# Patient Record
Sex: Male | Born: 1985 | Race: Black or African American | Hispanic: No | Marital: Single | Smoking: Current every day smoker
Health system: Southern US, Community
[De-identification: ages and names within clinical notes are randomized; demographics above are authoritative.]

## PROBLEM LIST (undated history)

## (undated) DIAGNOSIS — J42 Unspecified chronic bronchitis: Secondary | ICD-10-CM

## (undated) DIAGNOSIS — K5190 Ulcerative colitis, unspecified, without complications: Secondary | ICD-10-CM

## (undated) DIAGNOSIS — I10 Essential (primary) hypertension: Secondary | ICD-10-CM

## (undated) HISTORY — PX: HERNIA REPAIR: SHX51

## (undated) HISTORY — PX: APPENDECTOMY: SHX54

## (undated) HISTORY — PX: COLON SURGERY: SHX602

## (undated) HISTORY — PX: CHOLECYSTECTOMY: SHX55

---

## 2017-04-26 ENCOUNTER — Encounter (HOSPITAL_COMMUNITY): Payer: Self-pay | Admitting: Emergency Medicine

## 2017-04-26 ENCOUNTER — Emergency Department (HOSPITAL_COMMUNITY): Payer: Self-pay

## 2017-04-26 ENCOUNTER — Emergency Department (HOSPITAL_COMMUNITY)
Admission: EM | Admit: 2017-04-26 | Discharge: 2017-04-26 | Disposition: A | Discharge status: Home / Self Care | Payer: Self-pay | Attending: Emergency Medicine | Admitting: Emergency Medicine

## 2017-04-26 DIAGNOSIS — R1012 Left upper quadrant pain: Secondary | ICD-10-CM | POA: Insufficient documentation

## 2017-04-26 DIAGNOSIS — R112 Nausea with vomiting, unspecified: Secondary | ICD-10-CM | POA: Insufficient documentation

## 2017-04-26 LAB — URINALYSIS, ROUTINE W REFLEX MICROSCOPIC
BILIRUBIN URINE: NEGATIVE
Glucose, UA: NEGATIVE mg/dL
KETONES UR: 5 mg/dL — AB
NITRITE: NEGATIVE
PH: 6 (ref 5.0–8.0)
PROTEIN: NEGATIVE mg/dL
Specific Gravity, Urine: 1.038 — ABNORMAL HIGH (ref 1.005–1.030)

## 2017-04-26 LAB — CBC
HEMATOCRIT: 44.7 % (ref 39.0–52.0)
HEMOGLOBIN: 15.3 g/dL (ref 13.0–17.0)
MCH: 30.6 pg (ref 26.0–34.0)
MCHC: 34.2 g/dL (ref 30.0–36.0)
MCV: 89.4 fL (ref 78.0–100.0)
Platelets: 264 10*3/uL (ref 150–400)
RBC: 5 MIL/uL (ref 4.22–5.81)
RDW: 13.2 % (ref 11.5–15.5)
WBC: 7.6 10*3/uL (ref 4.0–10.5)

## 2017-04-26 LAB — COMPREHENSIVE METABOLIC PANEL
ALT: 17 U/L (ref 17–63)
ANION GAP: 10 (ref 5–15)
AST: 16 U/L (ref 15–41)
Albumin: 4 g/dL (ref 3.5–5.0)
Alkaline Phosphatase: 71 U/L (ref 38–126)
BUN: 11 mg/dL (ref 6–20)
CO2: 21 mmol/L — ABNORMAL LOW (ref 22–32)
Calcium: 9 mg/dL (ref 8.9–10.3)
Chloride: 108 mmol/L (ref 101–111)
Creatinine, Ser: 0.95 mg/dL (ref 0.61–1.24)
Glucose, Bld: 94 mg/dL (ref 65–99)
POTASSIUM: 4.1 mmol/L (ref 3.5–5.1)
Sodium: 139 mmol/L (ref 135–145)
Total Bilirubin: 0.5 mg/dL (ref 0.3–1.2)
Total Protein: 6.7 g/dL (ref 6.5–8.1)

## 2017-04-26 LAB — LIPASE, BLOOD: LIPASE: 98 U/L — AB (ref 11–51)

## 2017-04-26 MED ORDER — PANTOPRAZOLE SODIUM 40 MG PO TBEC: 40.0000 mg | DELAYED_RELEASE_TABLET | Freq: Every day | ORAL | 0 refills | Status: DC

## 2017-04-26 MED ORDER — PANTOPRAZOLE SODIUM 40 MG IV SOLR
40.0000 mg | Freq: Once | INTRAVENOUS | Status: AC
  Administered 2017-04-26: 40 mg via INTRAVENOUS
  Filled 2017-04-26: qty 40

## 2017-04-26 MED ORDER — ONDANSETRON 4 MG PO TBDP: ORAL_TABLET | ORAL | 0 refills | Status: DC

## 2017-04-26 MED ORDER — HYDROMORPHONE HCL 1 MG/ML IJ SOLN
1.0000 mg | Freq: Once | INTRAMUSCULAR | Status: AC
  Administered 2017-04-26: 1 mg via INTRAVENOUS
  Filled 2017-04-26: qty 1

## 2017-04-26 MED ORDER — CEPHALEXIN 500 MG PO CAPS: 500.0000 mg | ORAL_CAPSULE | Freq: Three times a day (TID) | ORAL | 0 refills | Status: AC

## 2017-04-26 MED ORDER — ONDANSETRON HCL 4 MG/2ML IJ SOLN
4.0000 mg | Freq: Once | INTRAMUSCULAR | Status: AC
  Administered 2017-04-26: 4 mg via INTRAVENOUS
  Filled 2017-04-26: qty 2

## 2017-04-26 MED ORDER — SODIUM CHLORIDE 0.9 % IV BOLUS (SEPSIS)
1000.0000 mL | Freq: Once | INTRAVENOUS | Status: AC
  Administered 2017-04-26: 1000 mL via INTRAVENOUS

## 2017-04-26 MED ORDER — IOPAMIDOL (ISOVUE-300) INJECTION 61%
INTRAVENOUS | Status: AC
  Administered 2017-04-26: 100 mL
  Filled 2017-04-26: qty 100

## 2017-04-26 MED ORDER — GI COCKTAIL ~~LOC~~
30.0000 mL | Freq: Once | ORAL | Status: AC
  Administered 2017-04-26: 30 mL via ORAL
  Filled 2017-04-26: qty 30

## 2017-04-26 NOTE — ED Notes (Signed)
ED Provider at bedside. 

## 2017-04-26 NOTE — ED Notes (Signed)
Pt reports LUQ pain with N/V x 2-3 days. Pt reports pain is much worse upon eating. Denies CP, SOB, fever, chills.

## 2017-04-26 NOTE — ED Notes (Signed)
Patient transported to CT 

## 2017-04-26 NOTE — ED Notes (Signed)
Pt given urinal for urine culture

## 2017-04-26 NOTE — ED Provider Notes (Signed)
[facility] EMERGENCY DEPARTMENT Provider Note   CSN: [id number] Arrival date & time: 04/26/17  1017     History   Chief Complaint Chief Complaint  Patient presents with  . Abdominal Pain    HPI  Justin Wise is a 32 y.o. Male with a history multiple abdominal surgeries, appendectomy, cholecystectomy, partial colectomy s/p colostomy reversal, and ventral hernia repair with mesh, who presents to the ED for evaluation of upper quadrant pain with nausea and vomiting for the past 2-3 days.  Patient reports pain is a constant dull ache that becomes sharp and worse after eating.  He reports he is taken several doses of Advil has not tried anything else to treat his symptoms.  He reports some associated nausea and vomiting, denies hematemesis,  few loose stools but no diarrhea, no melena or hematochezia. Pt denies fevers or chills, no chest pain or shortness of breath. Pt denies dysuria, frequency or penile discharge, no hx of kidney stones.  Patient denies any alcohol use, does smoke about a pack a day, denies any other substance use.      History reviewed. No pertinent past medical history.  There are no active problems to display for this patient.   History reviewed. No pertinent surgical history.     Home Medications    Prior to Admission medications   Not on File    Family History History reviewed. No pertinent family history.  Social History Social History   Tobacco Use  . Smoking status: Never Smoker  . Smokeless tobacco: Never Used  Substance Use Topics  . Alcohol use: No    Frequency: Never  . Drug use: No     Allergies   Patient has no allergy information on record.   Review of Systems Review of Systems  Constitutional: Negative for chills and fever.  HENT: Negative for congestion, rhinorrhea and sore throat.   Respiratory: Negative for cough and shortness of breath.   Cardiovascular: Negative for chest pain.  Gastrointestinal:  Positive for abdominal pain, nausea and vomiting. Negative for blood in stool, constipation and diarrhea.  Genitourinary: Negative for discharge, dysuria, frequency, penile pain, penile swelling, scrotal swelling and testicular pain.  Musculoskeletal: Negative for arthralgias and myalgias.  Skin: Negative for color change and pallor.  Neurological: Negative for dizziness and headaches.     Physical Exam Updated Vital Signs BP (!) 135/101 (BP Location: Right Arm)   Pulse 75   Temp 98.9 F (37.2 C) (Oral)   Resp 20   Ht 6\' 5"  (1.956 m)   Wt (!) 171.9 kg (379 lb)   SpO2 98%   BMI 44.94 kg/m   Physical Exam  Constitutional: He appears well-developed and well-nourished. No distress.  Patient appears uncomfortable but is in no acute distress  HENT:  Head: Normocephalic and atraumatic.  Eyes: Right eye exhibits no discharge. Left eye exhibits no discharge.  Cardiovascular: Normal rate, regular rhythm, normal heart sounds and intact distal pulses.  Pulmonary/Chest: Effort normal and breath sounds normal. No respiratory distress. He has no wheezes. He has no rhonchi. He has no rales.  Respirations equal and unlabored, patient able to speak in full sentences, lungs clear to auscultation bilaterally  Abdominal: Soft. Bowel sounds are normal. He exhibits no distension. There is tenderness. There is guarding.  Multiple previous surgical scars noted, abdomen is soft and nondistended with bowel sounds present, focal tenderness in the right upper quadrant and mild tenderness in the right lower quadrant with mild guarding, no  right-sided abdominal pain  Musculoskeletal: He exhibits no edema or deformity.  Neurological: He is alert. Coordination normal.  Skin: Skin is warm and dry. Capillary refill takes less than 2 seconds. He is not diaphoretic.  Psychiatric: He has a normal mood and affect. His behavior is normal.  Nursing note and vitals reviewed.    ED Treatments / Results  Labs (all  labs ordered are listed, but only abnormal results are displayed) Labs Reviewed  LIPASE, BLOOD - Abnormal; Notable for the following components:      Result Value   Lipase 98 (*)    All other components within normal limits  COMPREHENSIVE METABOLIC PANEL - Abnormal; Notable for the following components:   CO2 21 (*)    All other components within normal limits  URINALYSIS, ROUTINE W REFLEX MICROSCOPIC - Abnormal; Notable for the following components:   Specific Gravity, Urine 1.038 (*)    Hgb urine dipstick MODERATE (*)    Ketones, ur 5 (*)    Leukocytes, UA LARGE (*)    Bacteria, UA FEW (*)    Squamous Epithelial / LPF 0-5 (*)    All other components within normal limits  URINE CULTURE  CBC    EKG  EKG Interpretation None       Radiology Ct Abdomen Pelvis W Contrast  Result Date: 04/26/2017 CLINICAL DATA:  Left upper quadrant pain and nausea. EXAM: CT ABDOMEN AND PELVIS WITH CONTRAST TECHNIQUE: Multidetector CT imaging of the abdomen and pelvis was performed using the standard protocol following bolus administration of intravenous contrast. CONTRAST:  ISOVUE-300 IOPAMIDOL (ISOVUE-300) INJECTION 61% COMPARISON:  None. FINDINGS: Lower chest: There is minimal linear scarring or atelectasis at the left lung base. Tiny amount of pericardial fluid. Heart size is normal. Otherwise, the negative. Hepatobiliary: No focal liver abnormality is seen. Status post cholecystectomy. No biliary dilatation. Pancreas: Unremarkable. No pancreatic ductal dilatation or surrounding inflammatory changes. Spleen: Normal in size without focal abnormality. Adrenals/Urinary Tract: Adrenal glands are unremarkable. Kidneys are normal, without renal calculi, focal lesion, or hydronephrosis. Bladder is unremarkable. Stomach/Bowel: Surgical staple line in the sigmoid region consistent with previous colon resection. Surgical clips consistent with previous appendectomy. There are a few diverticula in the  descending colon. No diverticulitis. Vascular/Lymphatic: No significant vascular findings are present. No enlarged abdominal or pelvic lymph nodes. Reproductive: Prostate is unremarkable. Other: There is a lobulated area of subcutaneous fat anterior to the right rectus muscle above the umbilicus with thickened margins. The diaphragm is thickened but otherwise normal. This is not felt to be significant. 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 Chronic spondylolisthesis at L5-S1. No spinal stenosis. IMPRESSION: 1. No acute intra-abdominal abnormality. 2. Minimal diverticulosis of the left side of the colon. 3. Area of probable scarring in the subcutaneous fat of the right mid abdomen anterior to the right rectus muscle. Electronically Signed   By: Francene Boyers M.D.   On: 04/26/2017 15:01    Procedures Procedures (including critical care time)  Medications Ordered in ED Medications  sodium chloride 0.9 % bolus 1,000 mL (0 mLs Intravenous Stopped 04/26/17 1513)  ondansetron (ZOFRAN) injection 4 mg (4 mg Intravenous Given 04/26/17 1400)  HYDROmorphone (DILAUDID) injection 1 mg (1 mg Intravenous Given 04/26/17 1400)  iopamidol (ISOVUE-300) 61 % injection (100 mLs  Contrast Given 04/26/17 1431)  pantoprazole (PROTONIX) injection 40 mg (40 mg Intravenous Given 04/26/17 1533)  gi cocktail (Maalox,Lidocaine,Donnatal) (30 mLs Oral Given 04/26/17 1533)  Initial Impression / Assessment and Plan / ED Course  I have reviewed the triage vital signs and the nursing notes.  Pertinent labs & imaging results that were available during my care of the patient were reviewed by me and considered in my medical decision making (see chart for details).  Pt presents with 2 days of LUQ pain w/ associated with nausea and vomiting, no hematemesis or melena. No fevers or chills.  On exam patient is focally tender in the left upper quadrant with guarding, with mild left lower  quadrant tenderness.  Given location of patient's tenderness I am very suspicious of gastritis especially since pain is worse with eating, but given the patient has had numerous abdominal surgeries and is at high risk for adhesions, obstruction and has altered anatomy imaging is necessary, will get CT abdomen pelvis, and labs. Will give fluids, dilaudid and zofran.  Labs overall reassuring there is no leukocytosis, normal hemoglobin, no acute electrolyte derangements requiring intervention, normal kidney and liver function, lipase is mildly elevated at 98.  Patient's urine does show RBCs and too numerous to count WBCs with a few bacteria present, though patient denies urinary symptoms and feels fairly concerning for urinary tract infection, patient denies recent sexual activity or exposure to STDs and is not experiencing any symptoms in line with that.  Will send culture and treat with Keflex.  CT shows no acute intra-abdominal abnormality there is mild diverticulosis of the left side of the colon and stable scarring of the subcutaneous fat over the right mid abdomen where patient has previous surgical scar.  This is reassuring and further supports diagnosis of gastritis.  Will treat with Protonix and GI cocktail.  Patient reports feeling much better, tolerating p.o. here in the ED.  At this time patient is stable for discharge home with symptomatic treatment, will treat with Protonix, zofran and GI follow up. Will treat UTI with keflex and have pt follow up with PCP for recheck.  Return precautions discussed with the patient and he expresses understanding and is in agreement with plan.  Final Clinical Impressions(s) / ED Diagnoses   Final diagnoses:  Left upper quadrant pain  Non-intractable vomiting with nausea, unspecified vomiting type    ED Discharge Orders        Ordered    pantoprazole (PROTONIX) 40 MG tablet  Daily     04/26/17 1639    ondansetron (ZOFRAN ODT) 4 MG disintegrating tablet       04/26/17 1639    cephALEXin (KEFLEX) 500 MG capsule  3 times daily     04/26/17 1639       Dartha LodgeFord, Jenavie Stanczak N, PA-C 04/26/17 1648    Charlynne PanderYao, David Hsienta, MD 04/29/17 [phone]

## 2017-04-26 NOTE — Discharge Instructions (Signed)
Your evaluation today is reassuring, CT scan shows no acute abdominal abnormality and labs are reassuring.  Please begin taking Protonix once daily at least 30 minutes before your first meal, you may use Zofran as needed for nausea and Tums for breakthrough discomfort.  Please avoid ibuprofen, Aleve or other NSAIDs you may use Tylenol for pain.  Your urine did show some signs of infection please begin taking Keflex 3 times daily for the next 7 days please follow-up with primary care for recheck of your urine.  If your abdominal pain is persisting or worsening over the next 12-24 hours despite symptomatic treatment like we discussed.  You are having persistent nausea vomiting, fevers or chills, you develop blood in your vomit or in your stool or any other new or concerning symptoms develop please return to the ED for reevaluation.

## 2017-04-26 NOTE — ED Triage Notes (Signed)
Pt to ER for evaluation of LUQ abdominal constant, sharp pain, states 3-4 days with nausea, vomiting, and diarrhea.

## 2017-04-27 LAB — URINE CULTURE

## 2017-05-15 ENCOUNTER — Emergency Department (HOSPITAL_COMMUNITY)
Admission: EM | Admit: 2017-05-15 | Discharge: 2017-05-15 | Disposition: A | Discharge status: Home / Self Care | Payer: Self-pay | Attending: Emergency Medicine | Admitting: Emergency Medicine

## 2017-05-15 ENCOUNTER — Other Ambulatory Visit: Payer: Self-pay

## 2017-05-15 ENCOUNTER — Emergency Department (HOSPITAL_COMMUNITY): Payer: Self-pay

## 2017-05-15 ENCOUNTER — Encounter (HOSPITAL_COMMUNITY): Payer: Self-pay | Admitting: Emergency Medicine

## 2017-05-15 DIAGNOSIS — Z79899 Other long term (current) drug therapy: Secondary | ICD-10-CM | POA: Insufficient documentation

## 2017-05-15 DIAGNOSIS — K0889 Other specified disorders of teeth and supporting structures: Secondary | ICD-10-CM

## 2017-05-15 DIAGNOSIS — K029 Dental caries, unspecified: Secondary | ICD-10-CM | POA: Insufficient documentation

## 2017-05-15 DIAGNOSIS — R109 Unspecified abdominal pain: Secondary | ICD-10-CM | POA: Insufficient documentation

## 2017-05-15 LAB — URINALYSIS, ROUTINE W REFLEX MICROSCOPIC
Bacteria, UA: NONE SEEN
Bilirubin Urine: NEGATIVE
Glucose, UA: NEGATIVE mg/dL
Ketones, ur: 20 mg/dL — AB
LEUKOCYTES UA: NEGATIVE
Nitrite: NEGATIVE
PH: 6 (ref 5.0–8.0)
Protein, ur: NEGATIVE mg/dL
SPECIFIC GRAVITY, URINE: 1.018 (ref 1.005–1.030)

## 2017-05-15 MED ORDER — PENICILLIN V POTASSIUM 500 MG PO TABS
500.0000 mg | ORAL_TABLET | Freq: Once | ORAL | Status: DC
  Filled 2017-05-15: qty 1

## 2017-05-15 MED ORDER — KETOROLAC TROMETHAMINE 30 MG/ML IJ SOLN
30.0000 mg | Freq: Once | INTRAMUSCULAR | Status: AC
  Administered 2017-05-15: 30 mg via INTRAVENOUS

## 2017-05-15 MED ORDER — CYCLOBENZAPRINE HCL 10 MG PO TABS: 10.0000 mg | ORAL_TABLET | Freq: Two times a day (BID) | ORAL | 0 refills | Status: AC | PRN

## 2017-05-15 MED ORDER — SODIUM CHLORIDE 0.9 % IV BOLUS
1000.0000 mL | Freq: Once | INTRAVENOUS | Status: AC
Start: 2017-05-15 — End: 2017-05-15
  Administered 2017-05-15: 1000 mL via INTRAVENOUS

## 2017-05-15 MED ORDER — PENICILLIN V POTASSIUM 500 MG PO TABS: 500.0000 mg | ORAL_TABLET | Freq: Four times a day (QID) | ORAL | 0 refills | Status: AC

## 2017-05-15 MED ORDER — KETOROLAC TROMETHAMINE 30 MG/ML IJ SOLN
30.0000 mg | Freq: Once | INTRAMUSCULAR | Status: DC
  Filled 2017-05-15: qty 1

## 2017-05-15 NOTE — ED Provider Notes (Signed)
[facility]-EMERGENCY DEPT Provider Note   CSN: [id number] Arrival date & time: 05/15/17  1447     History   Chief Complaint Chief Complaint  Patient presents with  . Dental Pain  . Flank Pain    HPI Justin Wise is a 32 y.o. male who presents to ED for evaluation of multiple complaints. His first complaint is left-sided dental pain for the past 2 days.  He states that it began as nasal and sinus pressure that has now migrated to only the left side of his face and teeth.  He has not seen a dentist in about 15 years and states that he has had intermittent pain related to his dental caries and abnormal dentition for the past several years.  He reports only mild improvement with naproxen, Tylenol and ice packs in the area.  Cannot recall any inciting event that may have triggered the symptoms.  He states that the sinus pressure and sinus symptoms are normal for him when [place].  Denies any trouble breathing or trouble swallowing, fevers, sore throat, drooling, trismus, neck pain or rashes. His next complaint is left-sided flank pain and dysuria.  He was seen and evaluated in the ED 2 weeks ago and was told that he should not take the Keflex prescribed for him unless he gets a phone call of a positive urine culture.  He states that he was never called so he has not taken any medications.  He states that the dysuria has not improved and now that he is having left-sided flank pain.  Denies any diarrhea, constipation, nausea, vomiting, fevers, hematuria, history of kidney stones.  HPI  History reviewed. No pertinent past medical history.  There are no active problems to display for this patient.   History reviewed. No pertinent surgical history.      Home Medications    Prior to Admission medications   Medication Sig Start Date End Date Taking? Authorizing Provider  metoprolol tartrate (LOPRESSOR) 50 MG tablet Take 50 mg by mouth  2 (two) times daily.   Yes [provider]  naproxen sodium (ALEVE) 220 MG tablet Take 220 mg by mouth 2 (two) times daily as needed (Pain).   Yes [provider]  ondansetron (ZOFRAN ODT) 4 MG disintegrating tablet 4mg  ODT q4 hours prn nausea/vomit Patient not taking: Reported on 05/15/2017 04/26/17   Dartha LodgeFord, Kelsey N, PA-C  pantoprazole (PROTONIX) 40 MG tablet Take 1 tablet (40 mg total) by mouth daily. Before breakfast Patient not taking: Reported on 05/15/2017 04/26/17   Dartha LodgeFord, Kelsey N, PA-C  penicillin v potassium (VEETID) 500 MG tablet Take 1 tablet (500 mg total) by mouth 4 (four) times daily for 7 days. 05/15/17 05/22/17  Dietrich PatesKhatri, Andrick Rust, PA-C    Family History No family history on file.  Social History Social History   Tobacco Use  . Smoking status: Never Smoker  . Smokeless tobacco: Never Used  Substance Use Topics  . Alcohol use: No    Frequency: Never  . Drug use: No     Allergies   Morphine and related and Tramadol   Review of Systems Review of Systems  Constitutional: Negative for appetite change, chills and fever.  HENT: Positive for dental problem. Negative for drooling, ear pain, rhinorrhea, sneezing, sore throat and trouble swallowing.   Eyes: Negative for photophobia and visual disturbance.  Respiratory: Negative for cough, chest tightness, shortness of breath and wheezing.   Cardiovascular: Negative for chest pain  and palpitations.  Gastrointestinal: Negative for abdominal pain, blood in stool, constipation, diarrhea, nausea and vomiting.  Genitourinary: Positive for dysuria and flank pain. Negative for hematuria and urgency.  Musculoskeletal: Negative for myalgias, neck pain and neck stiffness.  Skin: Negative for rash.  Neurological: Negative for dizziness, weakness and light-headedness.     Physical Exam Updated Vital Signs BP (!) 149/105 (BP Location: Left Arm)   Pulse (!) 56   Temp 98.7 F (37.1 C) (Oral)   Resp 16   SpO2 99%    Physical Exam  Constitutional: He appears well-developed and well-nourished. No distress.  Nontoxic appearing and in no acute distress.  HENT:  Head: Normocephalic and atraumatic.  Nose: Nose normal.  Mouth/Throat: Oropharynx is clear and moist and mucous membranes are normal. He does not have dentures. No oral lesions. Abnormal dentition. Dental caries present. No dental abscesses or uvula swelling. No tonsillar exudate.    Overall poor dentition.  Several dental caries noted in the indicated teeth.  No gross dental abscess or site of drainage at this time. No facial, neck or cheek swelling noted. No pooling of secretions or trismus.  Normal voice noted with no difficulty swallowing or breathing.  No submandibular swelling, erythema or crepitus noted.  Eyes: Conjunctivae and EOM are normal. Right eye exhibits no discharge. Left eye exhibits no discharge. No scleral icterus.  Neck: Normal range of motion. Neck supple.  Cardiovascular: Normal rate, regular rhythm, normal heart sounds and intact distal pulses. Exam reveals no gallop and no friction rub.  No murmur heard. Pulmonary/Chest: Effort normal and breath sounds normal. No respiratory distress.  Abdominal: Soft. Bowel sounds are normal. He exhibits no distension. There is tenderness. There is no guarding.  Left-sided flank pain.  Several well-healing prior abdominal surgical scars noted in abdomen.  Musculoskeletal: Normal range of motion. He exhibits no edema.  Neurological: He is alert. He exhibits normal muscle tone. Coordination normal.  Skin: Skin is warm and dry. No rash noted.  Psychiatric: He has a normal mood and affect.  Nursing note and vitals reviewed.    ED Treatments / Results  Labs (all labs ordered are listed, but only abnormal results are displayed) Labs Reviewed  URINALYSIS, ROUTINE W REFLEX MICROSCOPIC - Abnormal; Notable for the following components:      Result Value   Hgb urine dipstick MODERATE (*)     Ketones, ur 20 (*)    Squamous Epithelial / LPF 0-5 (*)    All other components within normal limits    EKG None  Radiology Ct Renal Stone Study  Result Date: 05/15/2017 CLINICAL DATA:  Flank pain.  Stone disease suspected. EXAM: CT ABDOMEN AND PELVIS WITHOUT CONTRAST TECHNIQUE: Multidetector CT imaging of the abdomen and pelvis was performed following the standard protocol without IV contrast. COMPARISON:  CT abdomen and pelvis 04/26/2017. FINDINGS: Lower chest: Linear atelectasis or scarring is present in the left lower lobe. A 10 mm nodule is again noted. There is minimal atelectasis at the right base. Heart size is normal. No significant pleural or pericardial effusion is present. Hepatobiliary: No focal liver abnormality is seen. Status post cholecystectomy. No biliary dilatation. Pancreas: Unremarkable. No pancreatic ductal dilatation or surrounding inflammatory changes. Spleen: Normal in size without focal abnormality. Adrenals/Urinary Tract: The adrenal glands are normal bilaterally. Kidneys and ureters are unremarkable. There is no stone or obstruction. The urinary bladder is within normal limits. Stomach/Bowel: Stomach and duodenum are within normal limits. Small bowel adhesions are associated with previous laparotomy.  Mild dilation of small bowel up to 3.7 cm and fluid level is evident just proximal to the lesions. More distal small bowel is collapsed. Terminal ileum is within normal limits. Appendectomy is noted. The ascending and transverse colon are within normal limits. Descending colon is within normal limits. Colonic anastomosis is intact. Vascular/Lymphatic: No significant vascular findings are present. No enlarged abdominal or pelvic lymph nodes. Reproductive: Prostate is unremarkable. Other: No abdominal wall hernia or abnormality. No abdominopelvic ascites. Musculoskeletal: Chronic spondylolisthesis at L5-S1 is stable. Severe bilateral foraminal narrowing is evident. No acute  abnormalities present. Pelvis is intact. The hips are located and within normal limits bilaterally. IMPRESSION: 1. 10 mm pleural-based nodule in the left lower lobe is stable. This is likely rounded atelectasis. Consider one of the following in 3 months for both low-risk and high-risk individuals: (a) repeat chest CT, (b) follow-up PET-CT, or (c) tissue sampling. This recommendation follows the consensus statement: Guidelines for Management of Incidental Pulmonary Nodules Detected on CT Images: From the Fleischner Society 2017; Radiology 2017; 284:228-243. 2. Mild dilation proximal small bowel proximal to ventral adhesions without obstruction. 3. Cholecystectomy and appendectomy. Electronically Signed   By: Marin Roberts M.D.   On: 05/15/2017 21:15    Procedures Procedures (including critical care time)  Medications Ordered in ED Medications  sodium chloride 0.9 % bolus 1,000 mL (1,000 mLs Intravenous New Bag/Given 05/15/17 2030)  ketorolac (TORADOL) 30 MG/ML injection 30 mg (30 mg Intravenous Given 05/15/17 2029)     Initial Impression / Assessment and Plan / ED Course  I have reviewed the triage vital signs and the nursing notes.  Pertinent labs & imaging results that were available during my care of the patient were reviewed by me and considered in my medical decision making (see chart for details).     Patient presents to ED for evaluation of multiple complaints.  His first complaint is left-sided dental pain for the past 2 days.  He has several dental caries and poor dentition and several of his teeth especially on the left side.  He has no problems tolerating secretions and has no neck pain, fevers, gross abscess or site of drainage at this time.  Low suspicion for Ludwig's angina or other acute deep tissue infection of the neck.  His next complaint is left-sided flank pain.  He does have tenderness to palpation with no rebound or guarding noted.  He is afebrile.  He reports dysuria.   Urinalysis improved since previous visit on March 8.  He did have RBCs present.  CT renal stone study was negative.  Suspect that his symptoms could be musculoskeletal in nature and less likely for pyelonephritis.  Will treat with antibiotics for dental infection and advised him to follow-up with a dentist and PCP for further evaluation. Patient appears stable for discharge at this time. Strict return precautions given.  Portions of this note were generated with Scientist, clinical (histocompatibility and immunogenetics). Dictation errors may occur despite best attempts at proofreading.   Final Clinical Impressions(s) / ED Diagnoses   Final diagnoses:  Pain, dental    ED Discharge Orders        Ordered    penicillin v potassium (VEETID) 500 MG tablet  4 times daily     05/15/17 2119       Dietrich Pates, PA-C 05/15/17 2122    Terrilee Files, MD 05/16/17 1227

## 2017-05-15 NOTE — ED Notes (Signed)
Patient transported to CT 

## 2017-05-15 NOTE — ED Triage Notes (Signed)
Per EMS pt complaint of "all teeth hurting on left side" and left flank with dysuria.

## 2017-09-27 ENCOUNTER — Other Ambulatory Visit: Payer: Self-pay

## 2017-09-27 ENCOUNTER — Emergency Department (HOSPITAL_COMMUNITY)
Admission: EM | Admit: 2017-09-27 | Discharge: 2017-09-28 | Disposition: A | Discharge status: Home / Self Care | Payer: Self-pay | Attending: Emergency Medicine | Admitting: Emergency Medicine

## 2017-09-27 ENCOUNTER — Encounter (HOSPITAL_COMMUNITY): Payer: Self-pay | Admitting: Emergency Medicine

## 2017-09-27 DIAGNOSIS — F17210 Nicotine dependence, cigarettes, uncomplicated: Secondary | ICD-10-CM | POA: Insufficient documentation

## 2017-09-27 DIAGNOSIS — K2960 Other gastritis without bleeding: Secondary | ICD-10-CM | POA: Insufficient documentation

## 2017-09-27 DIAGNOSIS — K0889 Other specified disorders of teeth and supporting structures: Secondary | ICD-10-CM | POA: Insufficient documentation

## 2017-09-27 DIAGNOSIS — I10 Essential (primary) hypertension: Secondary | ICD-10-CM | POA: Insufficient documentation

## 2017-09-27 DIAGNOSIS — T39395A Adverse effect of other nonsteroidal anti-inflammatory drugs [NSAID], initial encounter: Secondary | ICD-10-CM

## 2017-09-27 DIAGNOSIS — T39391A Poisoning by other nonsteroidal anti-inflammatory drugs [NSAID], accidental (unintentional), initial encounter: Secondary | ICD-10-CM | POA: Insufficient documentation

## 2017-09-27 DIAGNOSIS — R1012 Left upper quadrant pain: Secondary | ICD-10-CM | POA: Insufficient documentation

## 2017-09-27 HISTORY — DX: Ulcerative colitis, unspecified, without complications: K51.90

## 2017-09-27 HISTORY — DX: Essential (primary) hypertension: I10

## 2017-09-27 HISTORY — DX: Unspecified chronic bronchitis: J42

## 2017-09-27 LAB — URINALYSIS, ROUTINE W REFLEX MICROSCOPIC
BILIRUBIN URINE: NEGATIVE
Bacteria, UA: NONE SEEN
GLUCOSE, UA: NEGATIVE mg/dL
KETONES UR: NEGATIVE mg/dL
Leukocytes, UA: NEGATIVE
Nitrite: NEGATIVE
PH: 6 (ref 5.0–8.0)
Protein, ur: NEGATIVE mg/dL
Specific Gravity, Urine: 1.012 (ref 1.005–1.030)

## 2017-09-27 MED ORDER — GI COCKTAIL ~~LOC~~
30.0000 mL | Freq: Once | ORAL | Status: AC
  Administered 2017-09-27: 30 mL via ORAL
  Filled 2017-09-27: qty 30

## 2017-09-27 MED ORDER — ONDANSETRON 4 MG PO TBDP
4.0000 mg | ORAL_TABLET | Freq: Once | ORAL | Status: AC | PRN
  Administered 2017-09-27: 4 mg via ORAL
  Filled 2017-09-27: qty 1

## 2017-09-27 NOTE — ED Notes (Signed)
Pt disposed of remaining ibuprofen tablets in trash.

## 2017-09-27 NOTE — ED Triage Notes (Addendum)
Pt states that he's been experiencing R sided dental pain d/t a cracked tooth. Has taken approx **42** 200mg  ibuprofen tablets in a little more than 24hrs. Now has abd pain, nausea, distention since this afternoon. Denies emesis. Reports extensive abd hx. Denies SI. "I just wanted the pain to go away."

## 2017-09-28 LAB — CBC
HCT: 43.5 % (ref 39.0–52.0)
Hemoglobin: 14.4 g/dL (ref 13.0–17.0)
MCH: 30.3 pg (ref 26.0–34.0)
MCHC: 33.1 g/dL (ref 30.0–36.0)
MCV: 91.4 fL (ref 78.0–100.0)
Platelets: 217 10*3/uL (ref 150–400)
RBC: 4.76 MIL/uL (ref 4.22–5.81)
RDW: 12.5 % (ref 11.5–15.5)
WBC: 7.4 10*3/uL (ref 4.0–10.5)

## 2017-09-28 LAB — COMPREHENSIVE METABOLIC PANEL
ALT: 16 U/L (ref 0–44)
ANION GAP: 8 (ref 5–15)
AST: 13 U/L — ABNORMAL LOW (ref 15–41)
Albumin: 3.9 g/dL (ref 3.5–5.0)
Alkaline Phosphatase: 53 U/L (ref 38–126)
BILIRUBIN TOTAL: 0.7 mg/dL (ref 0.3–1.2)
BUN: 6 mg/dL (ref 6–20)
CO2: 26 mmol/L (ref 22–32)
Calcium: 8.7 mg/dL — ABNORMAL LOW (ref 8.9–10.3)
Chloride: 104 mmol/L (ref 98–111)
Creatinine, Ser: 1.19 mg/dL (ref 0.61–1.24)
GFR calc Af Amer: >60 mL/min (ref >60)
Glucose, Bld: 89 mg/dL (ref 70–99)
POTASSIUM: 4 mmol/L (ref 3.5–5.1)
Sodium: 138 mmol/L (ref 135–145)
TOTAL PROTEIN: 6.5 g/dL (ref 6.5–8.1)

## 2017-09-28 LAB — LIPASE, BLOOD: Lipase: 45 U/L (ref 11–51)

## 2017-09-28 MED ORDER — ONDANSETRON 4 MG PO TBDP: 4.0000 mg | ORAL_TABLET | ORAL | 0 refills | Status: AC | PRN

## 2017-09-28 MED ORDER — GI COCKTAIL ~~LOC~~
30.0000 mL | Freq: Once | ORAL | Status: AC
  Administered 2017-09-28: 30 mL via ORAL
  Filled 2017-09-28: qty 30

## 2017-09-28 MED ORDER — TETANUS-DIPHTH-ACELL PERTUSSIS 5-2.5-18.5 LF-MCG/0.5 IM SUSP
0.5000 mL | Freq: Once | INTRAMUSCULAR | Status: AC
  Administered 2017-09-28: 0.5 mL via INTRAMUSCULAR
  Filled 2017-09-28: qty 0.5

## 2017-09-28 MED ORDER — AMOXICILLIN-POT CLAVULANATE 875-125 MG PO TABS
1.0000 | ORAL_TABLET | Freq: Once | ORAL | Status: AC
  Administered 2017-09-28: 1 via ORAL
  Filled 2017-09-28: qty 1

## 2017-09-28 MED ORDER — PANTOPRAZOLE SODIUM 40 MG PO TBEC: 40.0000 mg | DELAYED_RELEASE_TABLET | Freq: Every day | ORAL | 0 refills | Status: AC

## 2017-09-28 MED ORDER — PANTOPRAZOLE SODIUM 40 MG PO TBEC
40.0000 mg | DELAYED_RELEASE_TABLET | Freq: Every day | ORAL | Status: DC
  Administered 2017-09-28: 40 mg via ORAL
  Filled 2017-09-28: qty 1

## 2017-09-28 MED ORDER — SUCRALFATE 1 G PO TABS
1.0000 g | ORAL_TABLET | Freq: Once | ORAL | Status: AC
  Administered 2017-09-28: 1 g via ORAL
  Filled 2017-09-28: qty 1

## 2017-09-28 MED ORDER — AMOXICILLIN-POT CLAVULANATE 875-125 MG PO TABS: 1.0000 | ORAL_TABLET | Freq: Two times a day (BID) | ORAL | 0 refills | Status: AC

## 2017-09-28 MED ORDER — SUCRALFATE 1 G PO TABS: 1.0000 g | ORAL_TABLET | Freq: Three times a day (TID) | ORAL | 0 refills | Status: AC

## 2017-09-28 NOTE — ED Provider Notes (Signed)
[facility] EMERGENCY DEPARTMENT Provider Note   CSN: [id number] Arrival date & time: 09/27/17  2228     History   Chief Complaint Chief Complaint  Patient presents with  . Dental Pain  . Abdominal Pain  . Nausea    HPI Justin Wise is a 32 y.o. male past medical history of appendectomy, cholecystectomy, UC, who presents today for evaluation of abdominal pain and dental pain.  He reports that he has multiple bad teeth that usually hurt him however over the past few days it has gotten significantly worse.  He states that in the past 24 hours he took approximately #42 of the 200 mg ibuprofen tablets and after that started having belly pain.   He says to me that he knows his abdominal pain is from taking too many ibuprofen.  Is not concerned for another cause.  He denies nausea or vomiting,  No fevers.    HPI  Past Medical History:  Diagnosis Date  . Chronic bronchitis (HCC)   . Hypertension   . Ulcerative colitis (HCC)     There are no active problems to display for this patient.   Past Surgical History:  Procedure Laterality Date  . APPENDECTOMY    . CHOLECYSTECTOMY    . COLON SURGERY    . HERNIA REPAIR          Home Medications    Prior to Admission medications   Medication Sig Start Date End Date Taking? Authorizing Provider  ibuprofen (ADVIL,MOTRIN) 200 MG tablet Take 800 mg by mouth every 6 (six) hours as needed for mild pain.   Yes [provider]  amoxicillin-clavulanate (AUGMENTIN) 875-125 MG tablet Take 1 tablet by mouth every 12 (twelve) hours. 09/28/17   Cristina Gong, PA-C  cyclobenzaprine (FLEXERIL) 10 MG tablet Take 1 tablet (10 mg total) by mouth 2 (two) times daily as needed for muscle spasms. Patient not taking: Reported on 09/28/2017 05/15/17   Dietrich Pates, PA-C  ondansetron (ZOFRAN ODT) 4 MG disintegrating tablet Take 1 tablet (4 mg total) by mouth every 4 (four) hours as needed for nausea or vomiting. 09/28/17    Cristina Gong, PA-C  pantoprazole (PROTONIX) 40 MG tablet Take 1 tablet (40 mg total) by mouth daily. Before breakfast 09/28/17   Cristina Gong, PA-C  sucralfate (CARAFATE) 1 g tablet Take 1 tablet (1 g total) by mouth 4 (four) times daily -  with meals and at bedtime for 10 days. 09/28/17 10/08/17  Cristina Gong, PA-C    Family History History reviewed. No pertinent family history.  Social History Social History   Tobacco Use  . Smoking status: Current Every Day Smoker    Packs/day: 1.00  . Smokeless tobacco: Never Used  Substance Use Topics  . Alcohol use: Yes    Frequency: Never  . Drug use: Yes    Types: Marijuana     Allergies   Morphine and related and Tramadol   Review of Systems Review of Systems  Constitutional: Negative for chills and fever.  Respiratory: Negative for chest tightness and shortness of breath.   Cardiovascular: Negative for chest pain.  Gastrointestinal: Positive for abdominal pain. Negative for abdominal distention, constipation, nausea and vomiting.  Genitourinary: Negative for hematuria.  Musculoskeletal: Negative for back pain and neck pain.  All other systems reviewed and are negative.    Physical Exam Updated Vital Signs BP 140/84 (BP Location: Right Arm)   Pulse 80   Temp 98.8 F (37.1 C) (  Oral)   Resp 16   Ht 6\' 5"  (1.956 m)   Wt (!) 172.4 kg   SpO2 100%   BMI 45.06 kg/m   Physical Exam  Constitutional: He appears well-developed and well-nourished.  Non-toxic appearance. No distress.  HENT:  Head: Normocephalic and atraumatic.  Mouth/Throat: Oropharynx is clear and moist.  Eyes: Conjunctivae are normal. Right eye exhibits no discharge. Left eye exhibits no discharge. No scleral icterus.  Neck: Normal range of motion.  Cardiovascular: Normal rate, regular rhythm and normal heart sounds.  Pulmonary/Chest: Effort normal. No stridor. No respiratory distress.  Abdominal: Normal appearance and bowel sounds are  normal. He exhibits no distension. There is tenderness in the left upper quadrant. There is no rigidity, no rebound, no guarding and no CVA tenderness.  Musculoskeletal: He exhibits no edema or deformity.  Neurological: He is alert. He exhibits normal muscle tone.  Skin: Skin is warm and dry. He is not diaphoretic.  Psychiatric: He has a normal mood and affect. His behavior is normal.  Nursing note and vitals reviewed.    ED Treatments / Results  Labs (all labs ordered are listed, but only abnormal results are displayed) Labs Reviewed  COMPREHENSIVE METABOLIC PANEL - Abnormal; Notable for the following components:      Result Value   Calcium 8.7 (*)    AST 13 (*)    All other components within normal limits  URINALYSIS, ROUTINE W REFLEX MICROSCOPIC - Abnormal; Notable for the following components:   Hgb urine dipstick MODERATE (*)    All other components within normal limits  LIPASE, BLOOD  CBC    EKG None  Radiology No results found.  Procedures Procedures (including critical care time)  Medications Ordered in ED Medications  pantoprazole (PROTONIX) EC tablet 40 mg (40 mg Oral Given 09/28/17 0306)  ondansetron (ZOFRAN-ODT) disintegrating tablet 4 mg (4 mg Oral Given 09/27/17 2302)  gi cocktail (Maalox,Lidocaine,Donnatal) (30 mLs Oral Given 09/27/17 2302)  sucralfate (CARAFATE) tablet 1 g (1 g Oral Given 09/28/17 0306)  amoxicillin-clavulanate (AUGMENTIN) 875-125 MG per tablet 1 tablet (1 tablet Oral Given 09/28/17 0306)  Tdap (BOOSTRIX) injection 0.5 mL (0.5 mLs Intramuscular Given 09/28/17 0306)  gi cocktail (Maalox,Lidocaine,Donnatal) (30 mLs Oral Given 09/28/17 0306)     Initial Impression / Assessment and Plan / ED Course  I have reviewed the triage vital signs and the nursing notes.  Pertinent labs & imaging results that were available during my care of the patient were reviewed by me and considered in my medical decision making (see chart for details).  Clinical  Course as of Sep 28 608  Sat Sep 28, 2017  4098 Patient reevaluated, says that his belly is feeling better and wishes for discharge at this time.   [EH]    Clinical Course User Index [EH] Cristina Gong, PA-C    Patient presents today primarily for evaluation of dental pain.  In the course of treating his dental pain at home he took 42 tablets of 200 mg ibuprofen in the past 24 hours and then developed stomach pain.  He denies V/D, mild nausea.  His abdominal exam was reassuring.  His abdominal pain resolved with GI cocktail.  We discussed proper safe dosing of ibuprofen in the future, advised to avoid ibuprofen for one week.  Will treat presumed NSAID gastritis with Carafate and Protonix.  He was given zofran for nausea control.  He was given Rx for antibiotics for his tooth.  Given follow up with dentist on  call.  On recheck his abdomen was soft and non tender.  Discussed CT scan with patient who agrees it does not appear needed at this time.    His ibuprofen dosage based on his weight was about 49mgvEmjBCncyvS$ /kg, well under the 100mg /kg safety limit.    Return precautions were discussed with patient who states their understanding.  At the time of discharge patient denied any unaddressed complaints or concerns.  Patient is agreeable for discharge home.   Final Clinical Impressions(s) / ED Diagnoses   Final diagnoses:  Pain, dental  NSAID overdose, accidental or unintentional, initial encounter  Left upper quadrant pain  NSAID induced gastritis    ED Discharge Orders         Ordered    pantoprazole (PROTONIX) 40 MG tablet  Daily     09/28/17 0400    ondansetron (ZOFRAN ODT) 4 MG disintegrating tablet  Every 4 hours PRN     09/28/17 0400    sucralfate (CARAFATE) 1 g tablet  3 times daily with meals & bedtime     09/28/17 0400    amoxicillin-clavulanate (AUGMENTIN) 875-125 MG tablet  Every 12 hours     09/28/17 0400           Cristina GongHammond, Kaya Klausing W, PA-C 09/28/17 [id number]    Azalia Bilisampos,  Kevin, MD 09/28/17 [phone]

## 2017-09-28 NOTE — ED Notes (Signed)
Pt given ginger ale and graham crackers.

## 2017-09-28 NOTE — Discharge Instructions (Addendum)
Do not take any ibuprofen for at least one week.    Please take Tylenol (acetaminophen) to relieve your pain.  You may take tylenol, up to 1,000 mg (two extra strength pills).  Do not take more than 3,000 mg tylenol in a 24 hour period.  Please check all medication labels as many medications such as pain and cold medications may contain tylenol. Please do not drink alcohol while taking this medication.   You may have diarrhea from the antibiotics.  It is very important that you continue to take the antibiotics even if you get diarrhea unless a medical professional tells you that you may stop taking them.  If you stop too early the bacteria you are being treated for will become stronger and you may need different, more powerful antibiotics that have more side effects and worsening diarrhea.  Please stay well hydrated and consider probiotics as they may decrease the severity of your diarrhea.    For future guidelines the maximum dosing of ibuprofen is 800 mg every 8 hours.

## 2019-03-27 IMAGING — CT CT ABD-PELV W/ CM
2 of 4 series · 16 of 46 positions shown, 18 images · IV contrast (APPLIED)
Comparison: None.

CLINICAL DATA: Left upper quadrant pain and nausea.

EXAM:
CT ABDOMEN AND PELVIS WITH CONTRAST
TECHNIQUE: Multidetector CT imaging of the abdomen and pelvis was performed
using the standard protocol following bolus administration of
intravenous contrast.
CONTRAST:  100mL 2H5ZI0-DTT IOPAMIDOL (2H5ZI0-DTT) INJECTION 61%

[Series 3: abdomen 5.0 · axial · 0.98mm/px · z∈[+809,+1289]mm · 13 of 112 slices shown, 15 images]
[im 8/112  soft-tissue]
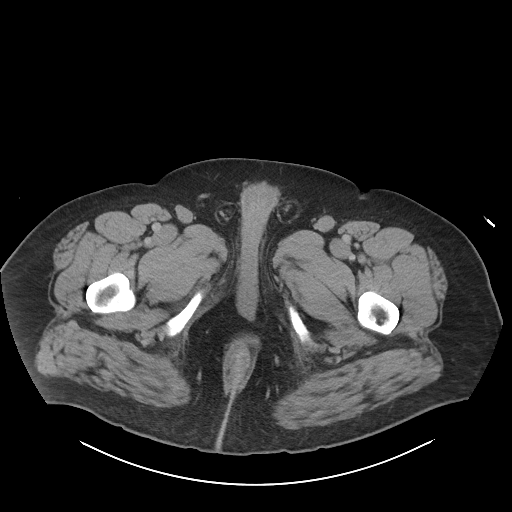
[im 8/112  bone]
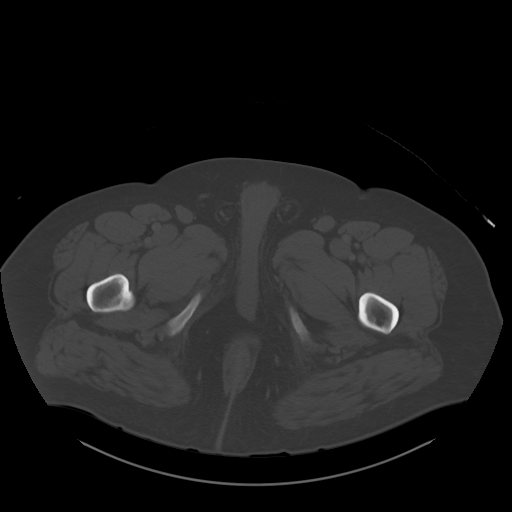
[im 15/112  soft-tissue]
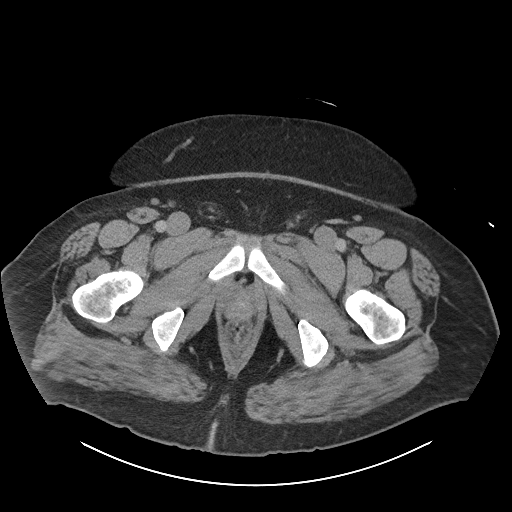
[im 23/112  soft-tissue]
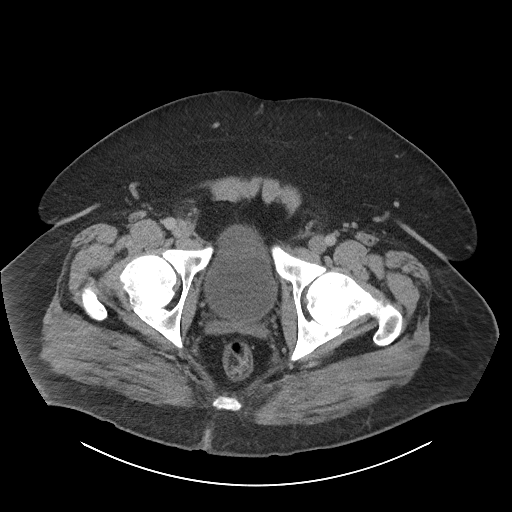
[im 30/112  soft-tissue]
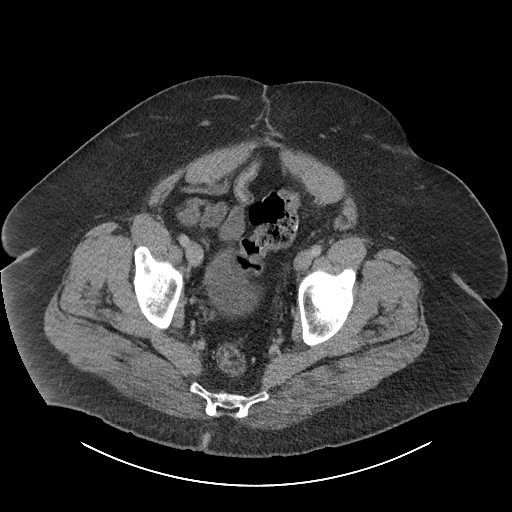
[im 38/112  soft-tissue]
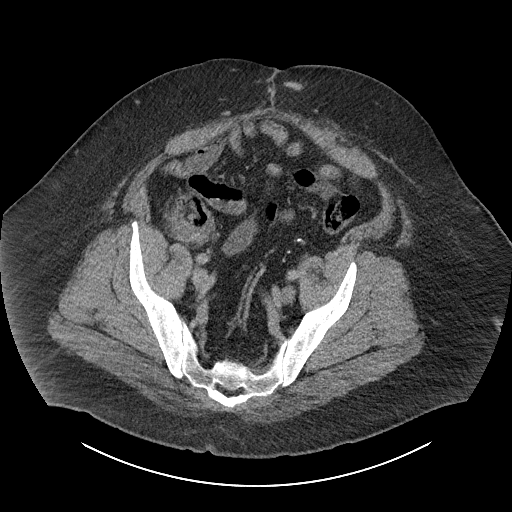
[im 45/112  soft-tissue]
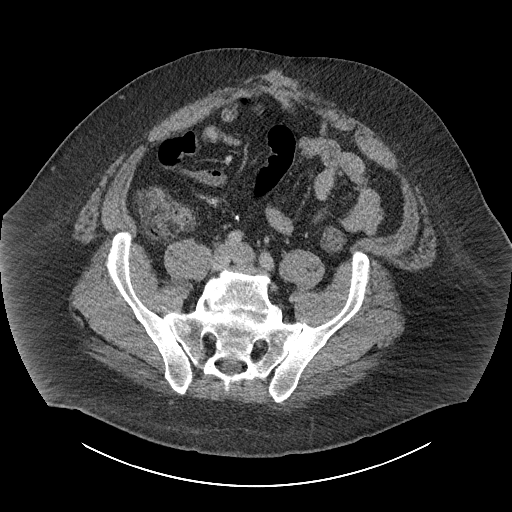
[im 60/112  soft-tissue]
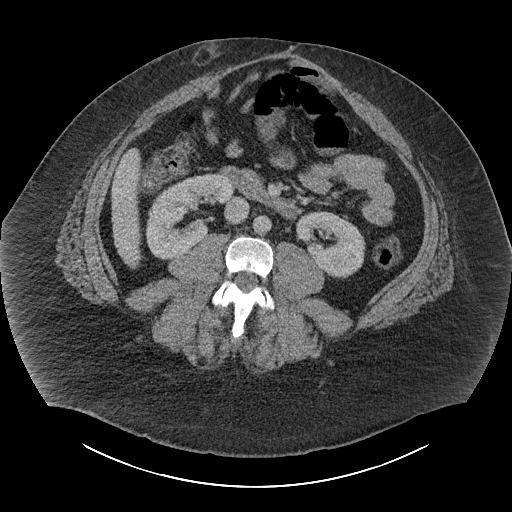
[im 67/112  soft-tissue]
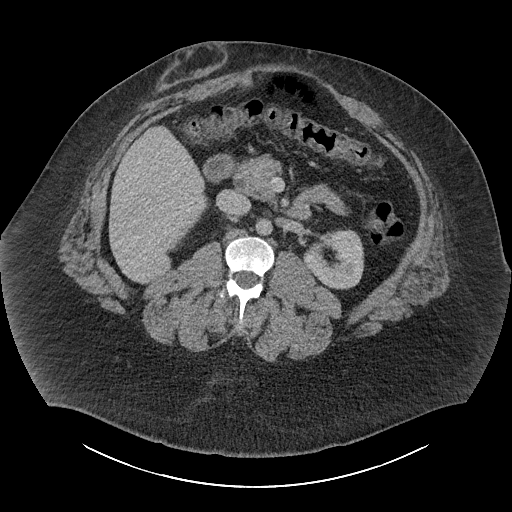
[im 75/112  soft-tissue]
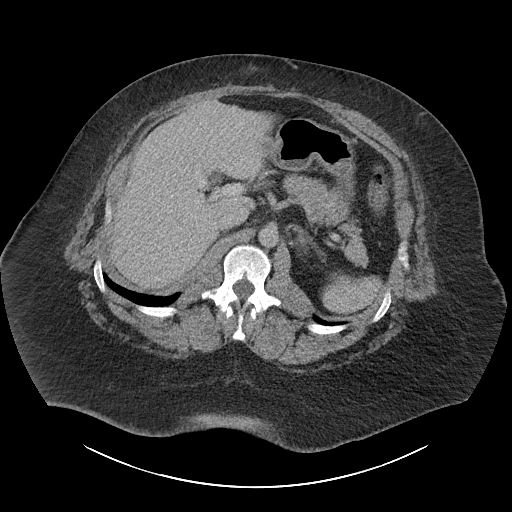
[im 75/112  bone]
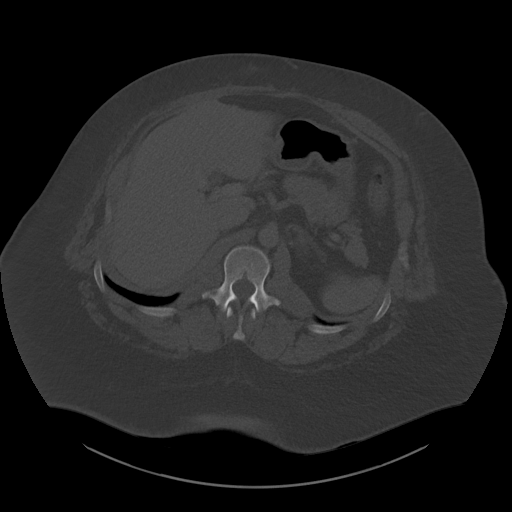
[im 82/112  soft-tissue]
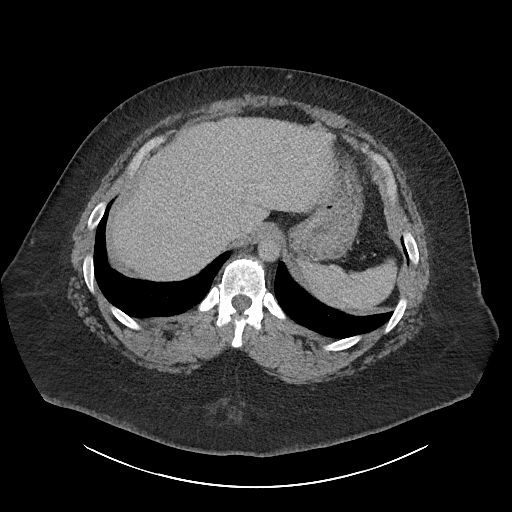
[im 89/112  soft-tissue]
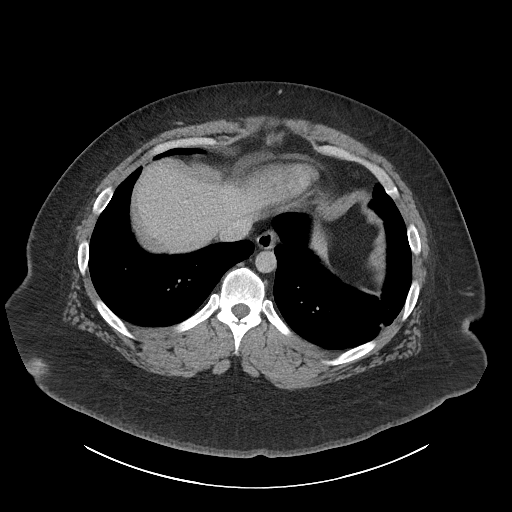
[im 97/112  soft-tissue]
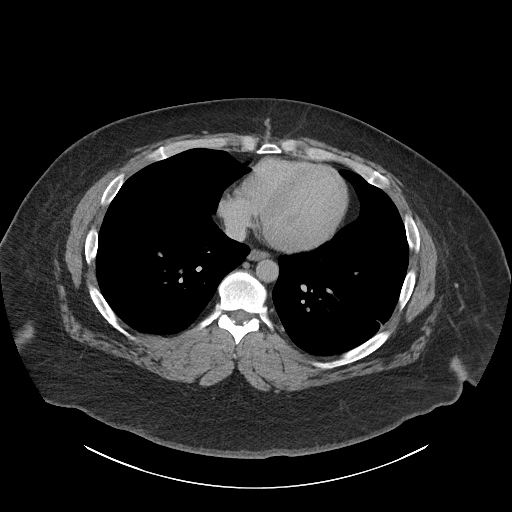
[im 104/112  soft-tissue]
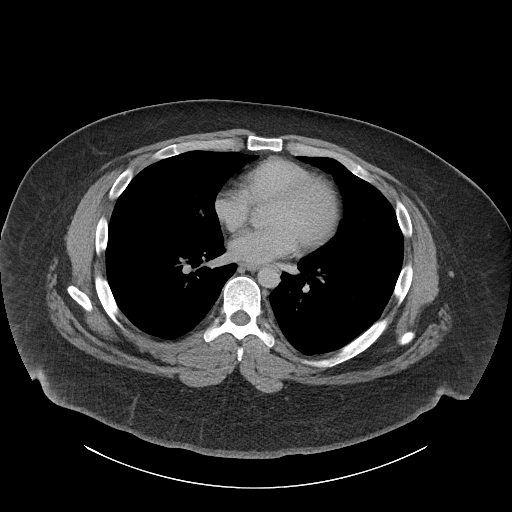

[Series 6: abdomen 3.0 mpr cor · coronal · 1.04mm/px · 3 of 129 slices shown]
[im 43/129  soft-tissue]
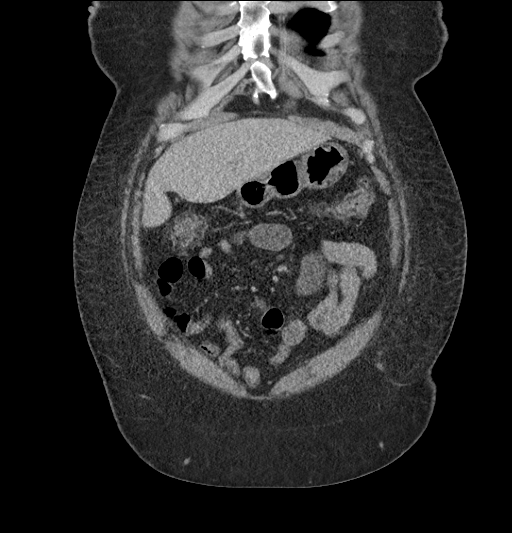
[im 57/129  soft-tissue]
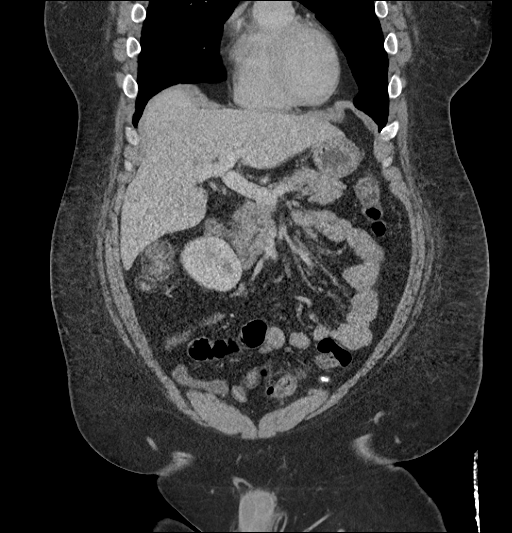
[im 72/129  soft-tissue]
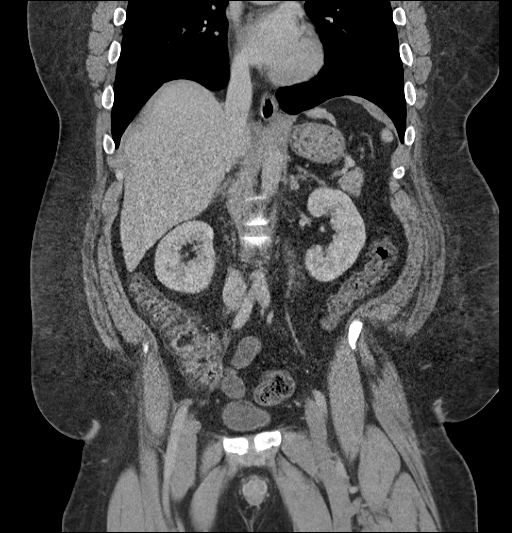

[16 of 46 positions shown; findings below may reference images not displayed]

FINDINGS: Lower chest: There is minimal linear scarring or atelectasis at the
left lung base. Tiny amount of pericardial fluid. Heart size is
normal. Otherwise, the negative.

Hepatobiliary: No focal liver abnormality is seen. Status post
cholecystectomy. No biliary dilatation.

Pancreas: Unremarkable. No pancreatic ductal dilatation or
surrounding inflammatory changes.

Spleen: Normal in size without focal abnormality.

Adrenals/Urinary Tract: Adrenal glands are unremarkable. Kidneys are
normal, without renal calculi, focal lesion, or hydronephrosis.
Bladder is unremarkable.

Stomach/Bowel: Surgical staple line in the sigmoid region consistent
with previous colon resection. Surgical clips consistent with
previous appendectomy. There are a few diverticula in the descending
colon. No diverticulitis.

Vascular/Lymphatic: No significant vascular findings are present. No
enlarged abdominal or pelvic lymph nodes.

Reproductive: Prostate is unremarkable.

Other: There is a lobulated area of subcutaneous fat anterior to the
right rectus muscle above the umbilicus with thickened margins. The
diaphragm is thickened but otherwise normal. This is not felt to be
significant.

Musculoskeletal: Chronic fusion of L5-S1 with chronic severe
bilateral foraminal stenosis at L5-S1. Chronic spondylolisthesis at
L5-S1. No spinal stenosis.
IMPRESSION: 1. No acute intra-abdominal abnormality.
2. Minimal diverticulosis of the left side of the colon.
3. Area of probable scarring in the subcutaneous fat of the right
mid abdomen anterior to the right rectus muscle.
